# Patient Record
Sex: Male | Born: 1975 | Race: Black or African American | Hispanic: No | Smoking: Never smoker
Health system: Southern US, Community
[De-identification: ages and names within clinical notes are randomized; demographics above are authoritative.]

## PROBLEM LIST (undated history)

## (undated) DIAGNOSIS — E119 Type 2 diabetes mellitus without complications: Secondary | ICD-10-CM

## (undated) DIAGNOSIS — E7800 Pure hypercholesterolemia, unspecified: Secondary | ICD-10-CM

---

## 2005-06-07 ENCOUNTER — Encounter: Admission: RE | Admit: 2005-06-07 | Discharge: 2005-06-07 | Payer: Self-pay | Admitting: Internal Medicine

## 2007-01-21 IMAGING — RF DG UGI W/ HIGH DENSITY W/KUB
20 of 24 series · 20 of 24 positions shown · non-contrast
Comparison: None.

CLINICAL DATA: 29 year old with heartburn and reflux. 
 DOUBLE CONTRAST UPPER G.I. WITH KUB:

[Series 1: run · 1 of 1 slices shown (1 of 20)]
[im 1/1]
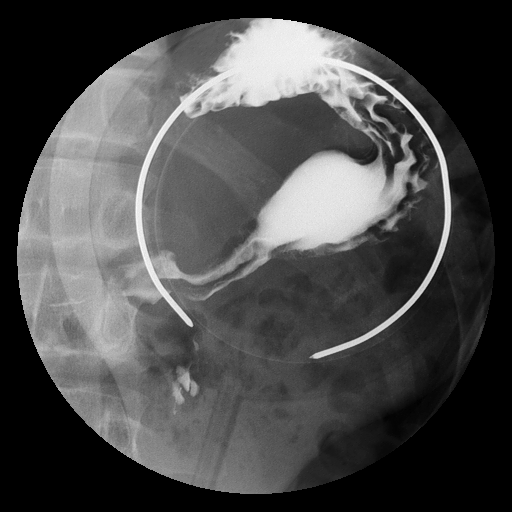

[Series 2: run · 1 of 1 slices shown (2 of 20)]
[im 1/1]
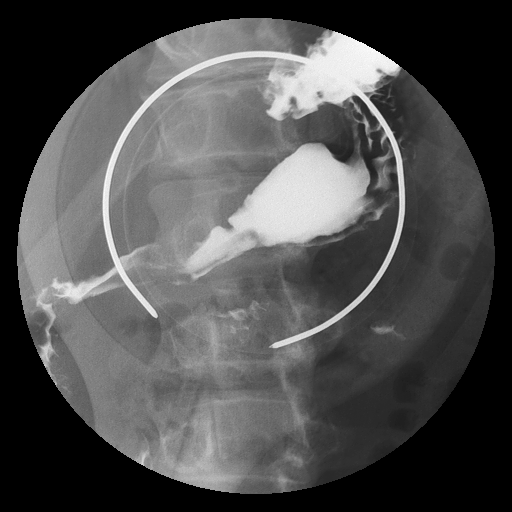

[Series 4: run · 1 of 1 slices shown (3 of 20)]
[im 1/1]
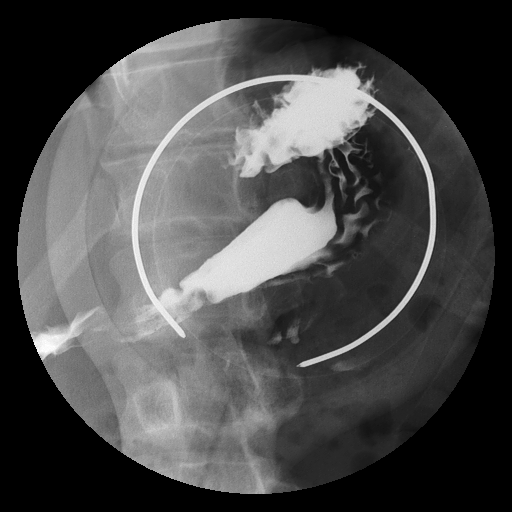

[Series 5: run · 1 of 1 slices shown (4 of 20)]
[im 1/1]
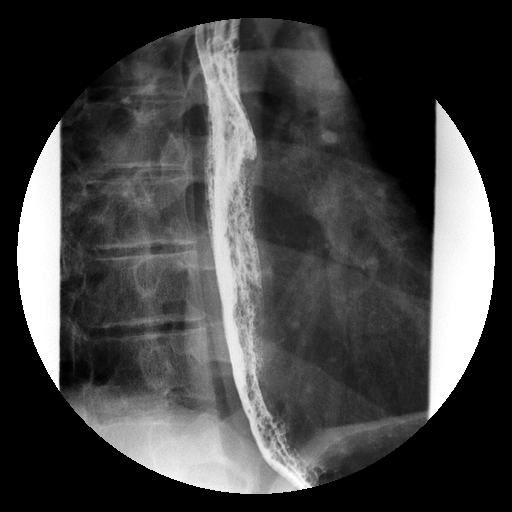

[Series 6: run · 1 of 1 slices shown (5 of 20)]
[im 1/1]
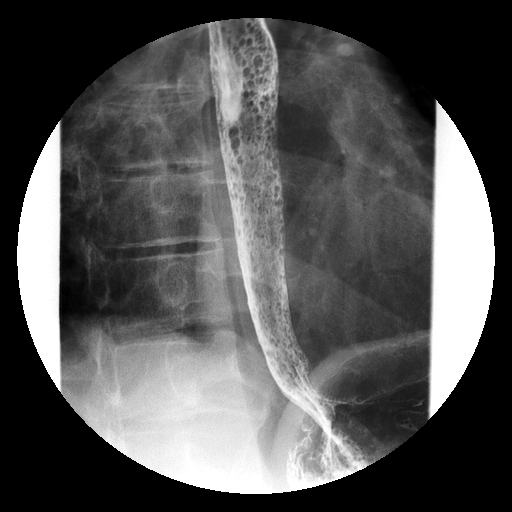

[Series 7: run · 1 of 1 slices shown (6 of 20)]
[im 1/1]
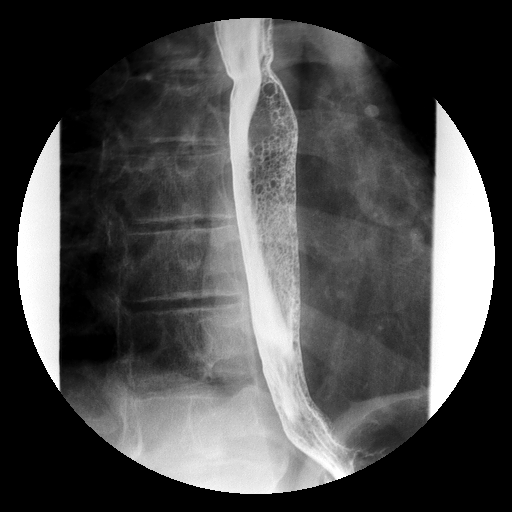

[Series 8: run · 1 of 1 slices shown (7 of 20)]
[im 1/1]
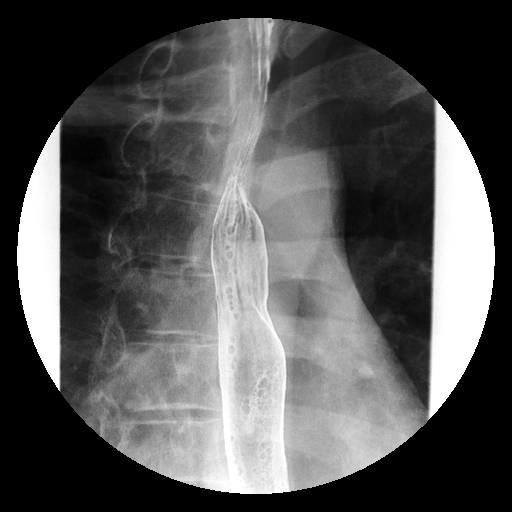

[Series 10: run · 1 of 1 slices shown (8 of 20)]
[im 1/1]
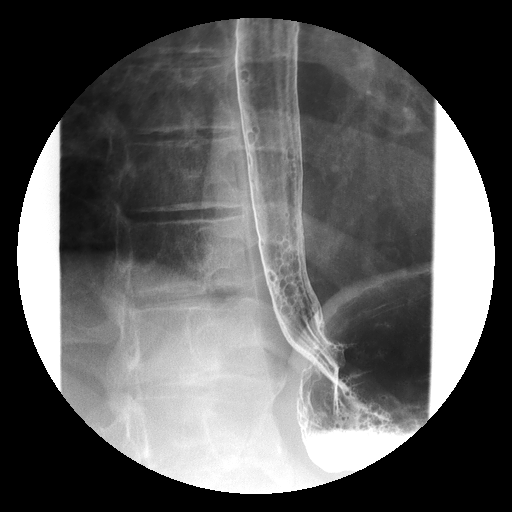

[Series 11: run · 1 of 1 slices shown (9 of 20)]
[im 1/1]
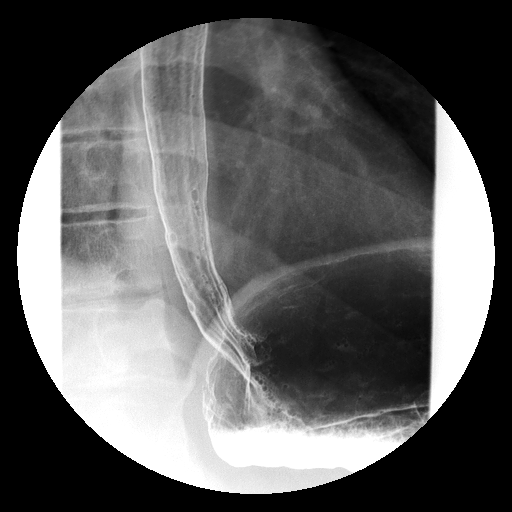

[Series 12: run · 1 of 1 slices shown (10 of 20)]
[im 1/1]
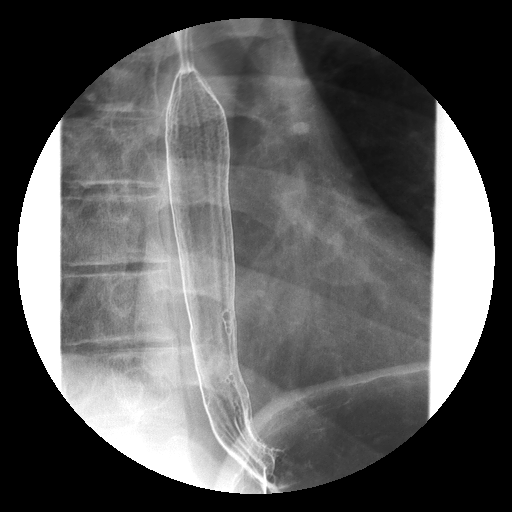

[Series 13: run · 1 of 1 slices shown (11 of 20)]
[im 1/1]
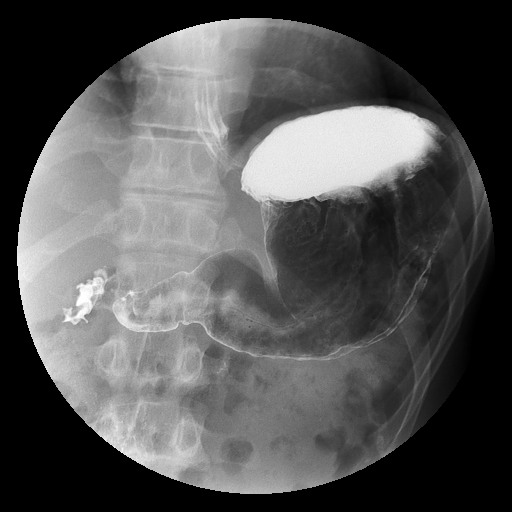

[Series 14: run · 1 of 1 slices shown (12 of 20)]
[im 1/1]
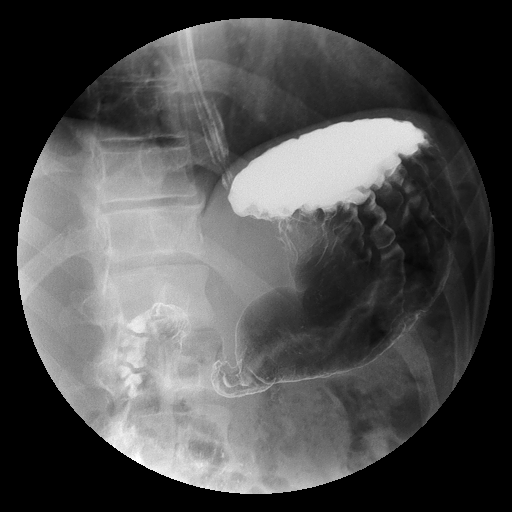

[Series 16: run · 1 of 1 slices shown (13 of 20)]
[im 1/1]
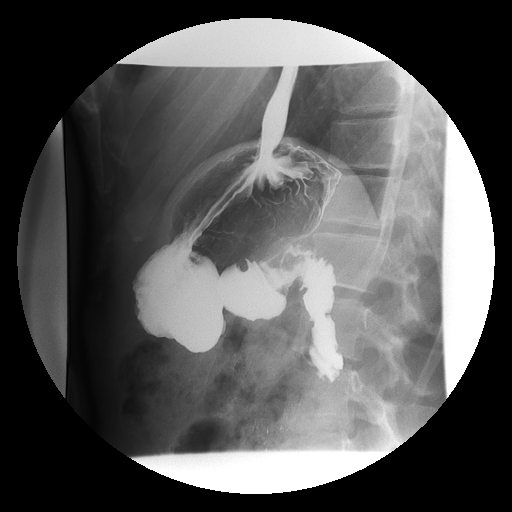

[Series 17: run · 1 of 1 slices shown (14 of 20)]
[im 1/1]
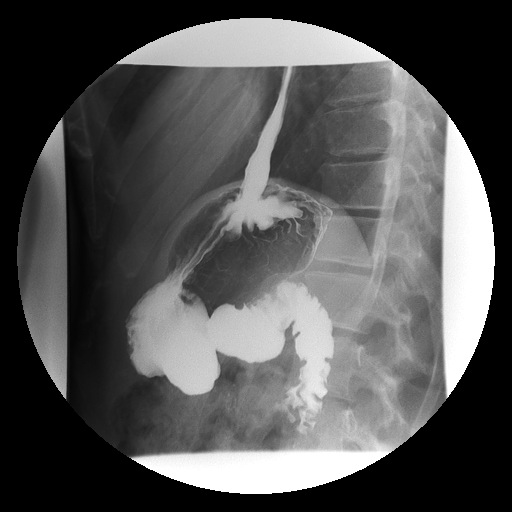

[Series 18: run · 1 of 1 slices shown (15 of 20)]
[im 1/1]
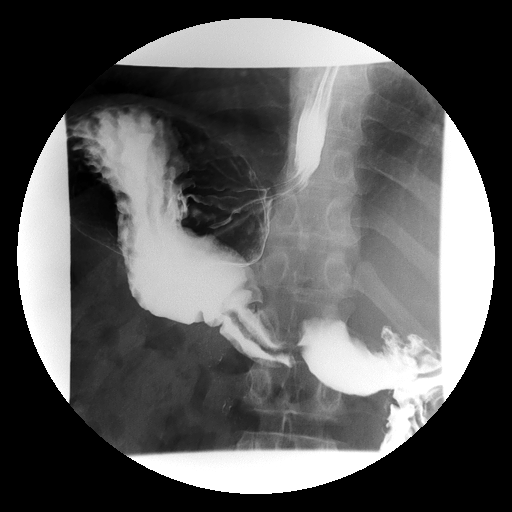

[Series 19: run · 1 of 1 slices shown (16 of 20)]
[im 1/1]
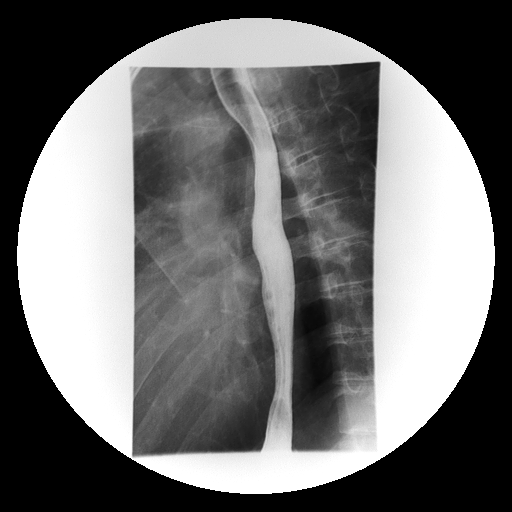

[Series 20: run · 1 of 1 slices shown (17 of 20)]
[im 1/1]
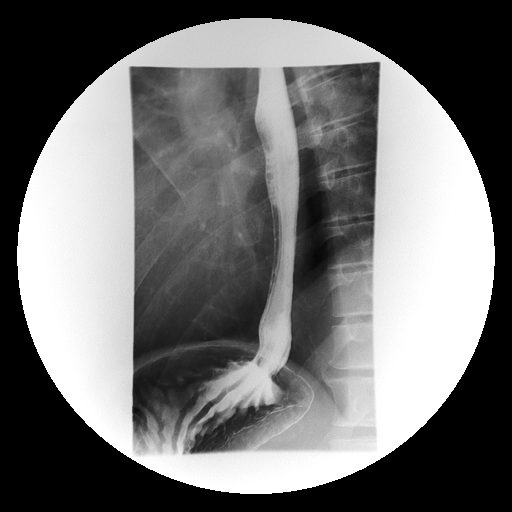

[Series 22: run · 1 of 1 slices shown (18 of 20)]
[im 1/1]
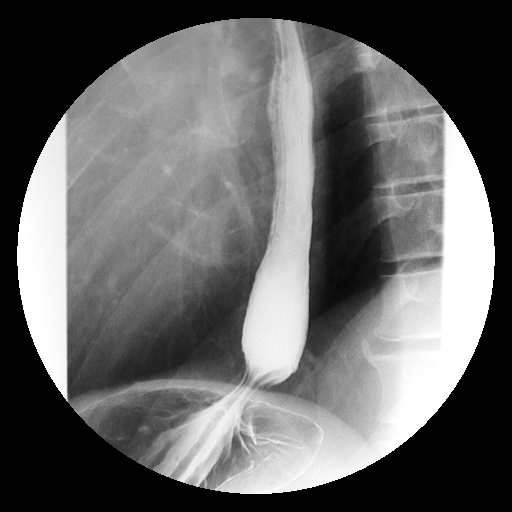

[Series 23: run · 1 of 1 slices shown (19 of 20)]
[im 1/1]
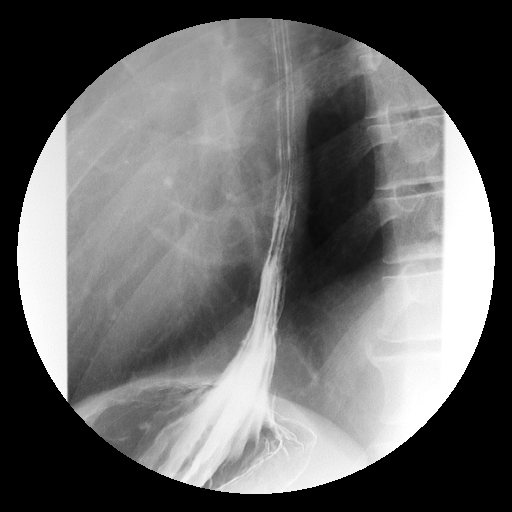

[Series 24: run · 1 of 1 slices shown (20 of 20)]
[im 1/1]
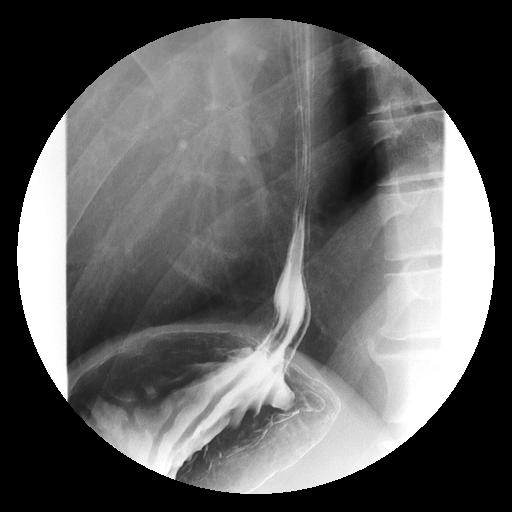

[20 of 24 positions shown; findings below may reference images not displayed]

FINDINGS: Initial barium swallows demonstrate normal esophageal motility.  No intrinsic or extrinsic lesions of the esophagus were identified and no mucosal abnormalities were seen.  The patient does have a small sliding type hiatal hernia. The patient had episodes of spontaneous and inducible GE reflux.  The stomach, duodenal bulb and C-loop are normal in appearance.
IMPRESSION: 1.  Small hiatal hernia with spontaneous and inducible GE reflux. 
 2.  Normal appearance of the stomach and duodenum.

## 2016-04-22 ENCOUNTER — Ambulatory Visit: Payer: Self-pay | Admitting: Podiatry

## 2016-05-13 ENCOUNTER — Ambulatory Visit (INDEPENDENT_AMBULATORY_CARE_PROVIDER_SITE_OTHER): Payer: BC Managed Care – PPO

## 2016-05-13 ENCOUNTER — Ambulatory Visit (INDEPENDENT_AMBULATORY_CARE_PROVIDER_SITE_OTHER): Payer: BC Managed Care – PPO | Admitting: Podiatry

## 2016-05-13 ENCOUNTER — Encounter: Payer: Self-pay | Admitting: Podiatry

## 2016-05-13 VITALS — BP 132/86 | HR 78 | Resp 18

## 2016-05-13 DIAGNOSIS — M779 Enthesopathy, unspecified: Secondary | ICD-10-CM | POA: Diagnosis not present

## 2016-05-13 DIAGNOSIS — M722 Plantar fascial fibromatosis: Secondary | ICD-10-CM | POA: Diagnosis not present

## 2016-05-13 DIAGNOSIS — M205X2 Other deformities of toe(s) (acquired), left foot: Secondary | ICD-10-CM

## 2016-05-16 NOTE — Progress Notes (Signed)
Subjective:     Patient ID: Denario Bagot, male   DOB: 03-23-1975, 41 y.o.   MRN: [id number]  HPI 41 year old male presents the office today for concerns of left foot pain and possible bunion. This been ongoing for several years. He states the areas painful pressure in shoes. He states that the area throbs and gets red at times. He said no recent treatment for. He states it is not bothersome today. 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 He denies any recent injury or trauma. No other complaints.  Review of Systems  All other systems reviewed and are negative.      Objective:   Physical Exam General: AAO x3, NAD  Dermatological: Skin is warm, dry and supple bilateral. Nails x 10 are well manicured; remaining integument appears unremarkable at this time. There are no open sores, no preulcerative lesions, no rash or signs of infection present.  Vascular: Dorsalis Pedis artery and Posterior Tibial artery pedal pulses are 2/4 bilateral with immedate capillary fill time. Pedal hair growth present. There is no pain with calf compression, swelling, warmth, erythema.   Neruologic: Grossly intact via light touch bilateral. Vibratory intact via tuning fork bilateral. Protective threshold with Semmes Wienstein monofilament intact to all pedal sites bilateral.   Musculoskeletal: There is a decreased range of motion left first MTPJ more so compared to the right. There is a dorsal medial spurring present off the first metatarsal. There is no edema, erythema, increase in warmth. There is minimal tenderness palpation to the area today. Decrease in medial arch height upon weightbearing. Occasionally he does get some symptoms of pain in the arch of the foot however he is not experiencing this today. Equinus is present. MMT 5/5.  Gait: Unassisted, Nonantalgic.      Assessment:     41 year old male left hallux limitus, plantar fasciitis/arch pain    Plan:      -Treatment options discussed including all alternatives, risks, and complications -Etiology of symptoms were discussed -X-rays were obtained and reviewed with the patient. Arthritic changes of present the first MPJ. No evidence of acute fracture. -I discussed steroid injection to the area today however as his been ongoing for 2 years I believe that a steroid injection would be temporary for him. I believe that long-term benefit more from a custom insert for modifications to help accommodate the hallux limitus. He was measured for the inserts today. We will check with insurance coverage per his request before ordering. Discussed that if we do not do custom been tried over-the-counter insert and I can try to do some modifications to that as well. Also discussion shoe modifications.   Ovid Curd, DPM

## 2016-06-04 ENCOUNTER — Other Ambulatory Visit: Payer: BC Managed Care – PPO

## 2016-06-13 ENCOUNTER — Other Ambulatory Visit: Payer: BC Managed Care – PPO

## 2016-06-20 ENCOUNTER — Other Ambulatory Visit (INDEPENDENT_AMBULATORY_CARE_PROVIDER_SITE_OTHER): Payer: BC Managed Care – PPO

## 2016-06-20 DIAGNOSIS — M205X2 Other deformities of toe(s) (acquired), left foot: Secondary | ICD-10-CM | POA: Diagnosis not present

## 2016-06-20 DIAGNOSIS — M779 Enthesopathy, unspecified: Secondary | ICD-10-CM

## 2016-06-20 DIAGNOSIS — M722 Plantar fascial fibromatosis: Secondary | ICD-10-CM

## 2019-12-03 ENCOUNTER — Ambulatory Visit (HOSPITAL_COMMUNITY)
Admission: EM | Admit: 2019-12-03 | Discharge: 2019-12-03 | Discharge status: Against Medical Advice | Payer: No Typology Code available for payment source

## 2019-12-03 ENCOUNTER — Other Ambulatory Visit: Payer: Self-pay

## 2019-12-04 ENCOUNTER — Encounter (HOSPITAL_COMMUNITY): Payer: Self-pay

## 2019-12-04 ENCOUNTER — Ambulatory Visit (HOSPITAL_COMMUNITY)
Admission: EM | Admit: 2019-12-04 | Discharge: 2019-12-04 | Disposition: A | Discharge status: Home / Self Care | Payer: No Typology Code available for payment source | Attending: Family Medicine | Admitting: Family Medicine

## 2019-12-04 VITALS — BP 140/78 | HR 85 | Temp 98.0°F | Resp 18

## 2019-12-04 DIAGNOSIS — S0990XA Unspecified injury of head, initial encounter: Secondary | ICD-10-CM

## 2019-12-04 HISTORY — DX: Type 2 diabetes mellitus without complications: E11.9

## 2019-12-04 HISTORY — DX: Pure hypercholesterolemia, unspecified: E78.00
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
## 2019-12-04 NOTE — Discharge Instructions (Signed)

## 2020-08-16 ENCOUNTER — Ambulatory Visit: Payer: Self-pay | Admitting: Podiatrist

## 2020-08-17 ENCOUNTER — Other Ambulatory Visit: Payer: Self-pay | Admitting: Podiatrist

## 2020-08-17 ENCOUNTER — Ambulatory Visit (INDEPENDENT_AMBULATORY_CARE_PROVIDER_SITE_OTHER): Payer: Self-pay

## 2020-08-17 ENCOUNTER — Other Ambulatory Visit: Payer: Self-pay

## 2020-08-17 ENCOUNTER — Encounter: Payer: Self-pay | Admitting: Podiatrist

## 2020-08-17 ENCOUNTER — Ambulatory Visit (INDEPENDENT_AMBULATORY_CARE_PROVIDER_SITE_OTHER): Payer: Self-pay | Admitting: Podiatrist

## 2020-08-17 DIAGNOSIS — M79672 Pain in left foot: Secondary | ICD-10-CM

## 2020-08-17 DIAGNOSIS — M2022 Hallux rigidus, left foot: Secondary | ICD-10-CM

## 2020-08-17 NOTE — Progress Notes (Signed)
       Chief Complaint  Patient presents with   Toe Pain    Left 1st digit lack of range of motion and painful, since 2018.     HPI: Patient is 45 y.o. male who presents today for the concerns as listed above.  He relates pain with range of motion at the great toe joint since 2018.  He denies any injury to the foot.   There are no problems to display for this patient.   Current Outpatient Medications on File Prior to Visit  Medication Sig Dispense Refill   atorvastatin (LIPITOR) 10 MG tablet Take 10 mg by mouth daily.     metFORMIN (GLUCOPHAGE) 500 MG tablet Take 500 mg by mouth 2 (two) times daily.     No current facility-administered medications on file prior to visit.    No Known Allergies  Review of Systems No fevers, chills, nausea, muscle aches, no difficulty breathing, no calf pain, no chest pain or shortness of breath.   Physical Exam  GENERAL APPEARANCE: Alert, conversant. Appropriately groomed. No acute distress.   VASCULAR: Pedal pulses palpable DP and PT bilateral.  Capillary refill time is immediate to all digits,  Proximal to distal cooling it warm to warm.  Digital perfusion adequate.   NEUROLOGIC: sensation is intact to 5.07 monofilament at 5/5 sites bilateral.  Light touch is intact bilateral, vibratory sensation intact bilateral  MUSCULOSKELETAL: acceptable muscle strength, tone and stability bilateral.  No gross boney pedal deformities noted.  No pain, crepitus or limitation noted with foot and ankle range of motion bilateral.   DERMATOLOGIC: skin is warm, supple, and dry.  No open lesions noted.  No rash, no pre ulcerative lesions. Digital nails are asymptomatic.    Xrays:  3 views left foot obtained.  Decreased joint space at the first metatarsal phalangeal joint is noted.  Joint spurring and osteophytic spurring is also present.  Short first metatarsal compared to the second and third metatarsal noted.  No acute osseous abnormalities present.    Assessment     ICD-10-CM   1. Pain in left foot  M79.672 CANCELED: DG Foot Complete Left    2. Hallux rigidus of left foot  M20.22        Plan  Discussed xray and exam findings with the patient.  Discussed the arthritis is present in the great toe joint and discussed the eitiology and pathology of this condition.  I discussed we could try a cortisone injection andhe would like to hold off today.  Recommended a stiff soled hiking shoe or a carbon fiber insert for his shoe-  we did not have the correct sizing in the carbon fiber insert and will call him when this is available.   Discussed he could ultimately require surgical intervention to correct this problem.  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  He will consider his options and let us know if he would like to follow up.

## 2020-08-17 NOTE — Patient Instructions (Signed)
For surgical considerations- follow up with Dr. Ardelle Anton or Dr. Charlsie Merles   Hallux Rigidus  Hallux rigidus is a type of joint pain or joint disease (arthritis) that affects your big toe (hallux). This condition involves the joint that connects the base of your big toe to the main part of your foot (metatarsophalangeal joint or MTP joint). This condition can cause your big toe to become stiff, painful, and difficult to move. Symptoms may get worse with movement or in cold or damp weather. Thecondition gets worse over time. What are the causes? This condition may be caused by having a foot that does not function the way that it should or that has an abnormal shape (structural deformity). These foot problems can run in families and may be passed down from parents to children (are hereditary). This condition can also be caused by: Injury. Overuse. Certain inflammatory diseases, including gout and rheumatoid arthritis. What increases the risk? You are more likely to develop this condition if you have: A foot bone (metatarsal) that is longer or higher than normal. A family history of hallux rigidus. Previously injured your big toe. Feet that do not have a curve (arch) on the inner side of the foot. This may be called flat feet or fallen arches. Ankles that turn in when you walk (pronation). Rheumatoid arthritis or gout. A job that requires you to stoop down often at work. What are the signs or symptoms? Symptoms of this condition include: Big toe pain. Stiffness and difficulty moving the big toe. Swelling of the toe and surrounding area. Bone spurs. These are bony growths that can form on the joint of the big toe. A limp. How is this diagnosed? This condition is diagnosed based on your medical history and a physical exam.You may also have X-rays. How is this treated? This condition is treated by: Wearing roomy, comfortable shoes that have a large toe box. Putting orthotic devices in your  shoes. Taking pain medicines. Having physical therapy. Icing the injured area. Alternating between putting your foot in cold water and then in warm water. If your condition is severe, treatment may include: Corticosteroid injections to relieve pain. Surgery to remove bone spurs, fuse damaged bones together, or replace the entire joint. Follow these instructions at home: Managing pain, stiffness, and swelling  Put your feet in cold water for 30 seconds, and then in warm water for 30 seconds. Alternate between the cold and warm water for 5 minutes. Do this several times a day or as told by your health care provider. If directed, put ice on the injured area. Put ice in a plastic bag. Place a towel between your skin and the bag. Leave the ice on for 20 minutes, 2-3 times a day.  General instructions Take over-the-counter and prescription medicines only as told by your health care provider. Do not wear high heels or other restrictive footwear. Wear comfortable, supportive shoes that have a large toe box. Wear shoe inserts (orthotics) as told by your health care provider, if this applies. Do foot exercises as instructed by your health care provider or a physical therapist. Keep all follow-up visits as told by your health care provider. This is important. Contact a health care provider if: You notice bone spurs or growths on or around your big toe. Your pain does not get better or it gets worse. You have pain while resting. You have pain in other parts of your body, such as your back, hip, or knee. You start to limp. Summary  Hallux rigidus is a condition that makes your big toe become stiff, painful, and difficult to move. It can be caused by injury, overuse, or inflammatory diseases. This condition may be treated with ice, medicines, physical therapy, and surgery. Do not wear high heels or other restrictive footwear. Wear comfortable, supportive shoes that have a large toe box. This  information is not intended to replace advice given to you by your health care provider. Make sure you discuss any questions you have with your healthcare provider. Document Revised: 11/07/2017 Document Reviewed: 11/10/2017 Elsevier Patient Education  2022 [employer].

## 2020-08-18 ENCOUNTER — Telehealth: Payer: Self-pay

## 2020-08-18 NOTE — Telephone Encounter (Signed)
Pt called to inform inserts were available, pt stated he will come in today to pick them up.

## 2023-03-06 ENCOUNTER — Encounter: Payer: Self-pay | Admitting: Podiatry

## 2023-03-06 ENCOUNTER — Ambulatory Visit (INDEPENDENT_AMBULATORY_CARE_PROVIDER_SITE_OTHER): Payer: 59

## 2023-03-06 ENCOUNTER — Ambulatory Visit (INDEPENDENT_AMBULATORY_CARE_PROVIDER_SITE_OTHER): Payer: 59 | Admitting: Podiatry

## 2023-03-06 DIAGNOSIS — M205X2 Other deformities of toe(s) (acquired), left foot: Secondary | ICD-10-CM | POA: Diagnosis not present

## 2023-03-06 DIAGNOSIS — M722 Plantar fascial fibromatosis: Secondary | ICD-10-CM

## 2023-03-06 MED ORDER — TRIAMCINOLONE ACETONIDE 10 MG/ML IJ SUSP: 5.0000 mg | Freq: Once | INTRAMUSCULAR | Status: DC

## 2023-03-06 MED ORDER — MELOXICAM 15 MG PO TABS: 15.0000 mg | ORAL_TABLET | Freq: Every day | ORAL | 0 refills | Status: AC

## 2023-03-06 NOTE — Progress Notes (Signed)
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 male presenting today with c/o pain in the bottom of the right heel.  States the pain is in the right plantar medial heel.  Had previously been wearing inserts.  Initially started in the mornings, now is constant with activity.  Patient states he ices occasionally.  Some aching for the past 3 months.  He also reports continued left first MPJ pain.  He saw Dr. Donzetta Kohut for this several years ago.  It is largely unchanged. Previously had been using inserts, states that this was very helpful for him.  Patient is diabetic.  Last A1c 7.0.  Patient also works as a Dentist.  Past Medical History:  Diagnosis Date   Diabetes mellitus without complication (HCC)    Hypercholesterolemia     History reviewed. No pertinent surgical history.  No Known Allergies   Physical Exam: General: The patient is alert and oriented x3 in no acute distress.  Dermatology:  No ecchymosis, erythema, or edema bilateral.  No open lesions.    Vascular: Palpable pedal pulses bilaterally. Capillary refill within normal limits.  No appreciable edema.    Neurological: Light touch sensation intact bilateral.  MMT 5/5 to lower extremity bilateral. Negative Tinel's sign with percussion of the posterior tibial nerve on the affected extremity.    Musculoskeletal Exam:  There is pain on palpation of the plantarmedial & plantarcentral aspect of right heel.  No gaps or nodules within the plantar fascia.  Positive Windlass mechanism bilateral.  Antalgic gait noted with first few steps upon standing.  No pain on palpation of  achilles tendon bilateral.  Ankle df less than 10 degrees with knee extended b/l.  Pes planus foot type bilaterally.  Decreased first MPJ range of motion noted bilaterally, left greater than right.  No pain on direct palpation of the joint.  Dorsal jamming appreciated.  Radiographic Exam: Right foot 3 views 03/05/2022 Normal osseous mineralization. Joint spaces preserved.  No fractures or osseous irregularities noted.  Pes planus foot type.  Some spur formation present to Achilles insertion.  Left foot 3 views 03/05/2022: Normal osseous mineralization. Joint spaces preserved.  No fractures or osseous irregularities noted.  Pes planus foot type.  Dorsal flag sign first metatarsal head.  Some valgus drift to the first toe.  Assessment/Plan of Care: 1. Hallux limitus of left foot   2. Plantar fasciitis of right foot     Meds ordered this encounter  Medications   triamcinolone acetonide (KENALOG) 10 MG/ML injection 5 mg   meloxicam (MOBIC) 15 MG tablet    Sig: Take 1 tablet (15 mg total) by mouth daily for 21 days.    Dispense:  21 tablet    Refill:  0   None  # Right foot plan fasciitis -Reviewed etiology of plantar fasciitis with patient.  Radiographs taken and reviewed with patient.  Discussed treatment options with patient today, including cortisone injection, NSAID course of treatment, stretching exercises, physical therapy, use of night splint, rest, icing the heel, arch supports/orthotics,  and supportive shoe gear.    With the patient's verbal consent, a corticosteroid injection was administered to the right heel, consisting of a mixture of 1% lidocaine plain, 0.5% Sensorcaine plain, and Kenalog-10 for a total of 1.5cc administered.  A Band-aid was applied.  Plantar fascia brace dispensed and applied.  # Left foot hallux limitus -Did discuss conservative versus surgical treatment for this condition.  Discussed possible joint cleaner procedure.  Conservatively, discussed use of stiff  soled shoes.  Patient states that he has tried some inserts in the past which have not been very helpful for him. -Radiographs reviewed with the patient -Course of oral meloxicam prescribed, beneficial for both the plan fasciitis and for the first MPJ pain. -Patient electing to continue with conservative therapy at least until heel pain is resolved. -I certify that this diagnosis represents a distinct and separate diagnosis that requires evaluation and treatment separate from other procedures or diagnosis   Return in 3 weeks (on 03/27/2023), or if symptoms worsen or fail to improve, for Plantar Fasciitis.   Bronwen Betters, DPM, AACFAS [facility]     [address] [place], [place] [address]                Office [phone]  Fax [phone]

## 2023-03-06 NOTE — Patient Instructions (Signed)

## 2023-07-10 ENCOUNTER — Ambulatory Visit: Admitting: Podiatry

## 2023-07-10 ENCOUNTER — Encounter: Payer: Self-pay | Admitting: Podiatry

## 2023-07-10 DIAGNOSIS — M2142 Flat foot [pes planus] (acquired), left foot: Secondary | ICD-10-CM

## 2023-07-10 DIAGNOSIS — M722 Plantar fascial fibromatosis: Secondary | ICD-10-CM

## 2023-07-10 DIAGNOSIS — M2141 Flat foot [pes planus] (acquired), right foot: Secondary | ICD-10-CM

## 2023-07-10 MED ORDER — TRIAMCINOLONE ACETONIDE 10 MG/ML IJ SUSP
10.0000 mg | Freq: Once | INTRAMUSCULAR | Status: AC
  Administered 2023-07-10: 10 mg

## 2023-07-10 MED ORDER — MELOXICAM 15 MG PO TABS: 15.0000 mg | ORAL_TABLET | Freq: Every day | ORAL | 0 refills | Status: AC

## 2023-07-10 NOTE — Patient Instructions (Addendum)

## 2023-07-10 NOTE — Progress Notes (Signed)
 Chief Complaint  Patient presents with   Plantar Fasciitis    Rm15 Plantar fasciitis flare right foot. Pain started 3 months ago with aching and soreness.    HPI: 48 y.o. male presenting today following up for right heel plantar fasciitis.  He was doing well for a while after initial injection.  This been bothering him again for couple months at this point.  Complains of sharp stabbing pain to right heel.  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  Past Medical History:  Diagnosis Date   Diabetes mellitus without complication (HCC)    Hypercholesterolemia     History reviewed. No pertinent surgical history.  No Known Allergies   Physical Exam: General: The patient is alert and oriented x3 in no acute distress.  Dermatology:  No ecchymosis, erythema, or edema bilateral.  No open lesions.    Vascular: Palpable pedal pulses bilaterally. Capillary refill within normal limits.  No appreciable edema.    Neurological: Light touch sensation intact bilateral.  MMT 5/5 to lower extremity bilateral. Negative Tinel's sign with percussion of the posterior tibial nerve on the affected extremity.    Musculoskeletal Exam:  There is pain on palpation of the plantarmedial & plantarcentral aspect of right heel.  No gaps or nodules within the plantar fascia.  Positive Windlass mechanism bilateral.  Antalgic gait noted with first few steps upon standing.  No pain on palpation of achilles tendon bilateral.  Ankle df less than 10 degrees with knee extended b/l.  Pes planus foot type bilaterally.  Decreased first MPJ range of motion noted bilaterally, left greater than right.  No pain on direct palpation of the joint.  Dorsal jamming appreciated.  Radiographic Exam: Right foot 3 views 03/05/2022 Normal osseous mineralization. Joint spaces preserved.  No fractures or osseous irregularities noted.  Pes planus foot type.  Some spur formation  present to Achilles insertion.  Left foot 3 views 03/05/2022: Normal osseous mineralization. Joint spaces preserved.  No fractures or osseous irregularities noted.  Pes planus foot type.  Dorsal flag sign first metatarsal head.  Some valgus drift to the first toe.  Assessment/Plan of Care: 1. Plantar fasciitis of right foot   2. Pes planus of both feet     Meds ordered this encounter  Medications   triamcinolone  acetonide (KENALOG ) 10 MG/ML injection 10 mg   meloxicam  (MOBIC ) 15 MG tablet    Sig: Take 1 tablet (15 mg total) by mouth daily.    Dispense:  30 tablet    Refill:  0   None  # Right foot plan fasciitis -Reviewed etiology of plantar fasciitis with patient. Discussed treatment options with patient today, including cortisone injection, NSAID course of treatment, stretching exercises, physical therapy, use of night splint, rest, icing the heel, arch supports/orthotics, and supportive shoe gear.    Previously dispensed fascial brace and he has been using night splint.  Continue with this.  Reviewed stretching regimen with patient.  Second course of oral meloxicam  15 mg a day once a day 30 tablets  With the patient's verbal consent, a corticosteroid injection was administered to the right heel, consisting of a mixture of 1% lidocaine plain, 0.5% Sensorcaine plain, and Kenalog -10 for a total of 1.5cc administered.  A Band-aid was applied.  Patient may benefit from custom orthotics if we could relief of symptoms going forward due to his pes planus and left foot hallux  limitus.  Return in about 2 weeks (around 07/24/2023) for Plantar Fasciitis.   Eve Hinders, DPM, AACFAS [facility]     [address] [place], [place] [address]                Office [phone]  Fax [phone]

## 2023-07-13 ENCOUNTER — Encounter: Payer: Self-pay | Admitting: Podiatry

## 2023-07-25 ENCOUNTER — Ambulatory Visit: Admitting: Podiatry

## 2023-07-25 ENCOUNTER — Encounter: Payer: Self-pay | Admitting: Podiatry

## 2023-07-25 DIAGNOSIS — M2142 Flat foot [pes planus] (acquired), left foot: Secondary | ICD-10-CM | POA: Diagnosis not present

## 2023-07-25 DIAGNOSIS — M2141 Flat foot [pes planus] (acquired), right foot: Secondary | ICD-10-CM

## 2023-07-25 DIAGNOSIS — M722 Plantar fascial fibromatosis: Secondary | ICD-10-CM

## 2023-07-25 NOTE — Progress Notes (Unsigned)
       Chief Complaint  Patient presents with   Follow-up    Patient states he still is having some pain but not to bad.     HPI: 48 y.o. male presenting today following up for right heel plantar fasciitis.  He reports 50% improvement or so from last visit since injection.  Reports mixed compliance with the plantar fascial brace.  He states that he has been using some over-the-counter inserts as well.  Past Medical History:  Diagnosis Date   Diabetes mellitus without complication (HCC)    Hypercholesterolemia     History reviewed. No pertinent surgical history.  No Known Allergies   Physical Exam: General: The patient is alert and oriented x3 in no acute distress.  Dermatology:  No ecchymosis, erythema, or edema bilateral.  No open lesions.    Vascular: Palpable pedal pulses bilaterally. Capillary refill within normal limits.  No appreciable edema.    Neurological: Light touch sensation intact bilateral.  MMT 5/5 to lower extremity bilateral. Negative Tinel's sign with percussion of the posterior tibial nerve on the affected extremity.    Musculoskeletal Exam:  There is decreased pain on palpation of the plantarmedial & plantarcentral aspect of right heel.  No gaps or nodules within the plantar fascia.  Positive Windlass mechanism bilateral.  Antalgic gait noted with first few steps upon standing.  No pain on palpation of achilles tendon bilateral.  Ankle df less than 10 degrees with knee extended b/l.  Pes planus foot type bilaterally.  Decreased first MPJ range of motion noted bilaterally, left greater than right.  No pain on direct palpation of the joint.  Dorsal jamming appreciated.   Assessment/Plan of Care: 1. Plantar fasciitis of right foot   2. Pes planus of both feet     No orders of the defined types were placed in this encounter.  None  # Right foot plan fasciitis -Reviewed etiology of plantar fasciitis with patient. Discussed treatment options with patient  today, including cortisone injection, NSAID course of treatment, stretching exercises, physical therapy, use of night splint, rest, icing the heel, arch supports/orthotics, and supportive shoe gear.    Previously dispensed fascial brace and he has been using night splint.  Continue with this.  Reviewed proper use and application.  Reviewed stretching regimen with patient.  Complete course of oral meloxicam  15 mg a day once a day 30 tablets  Did discuss a second corticosteroid injection today.  He states that he will be on his feet quite a bit today and over the weekend, therefore deferring this.  Did discuss importance of rest and the recovery from plantar fasciitis.  Patient may benefit from custom orthotics if we could relief of symptoms going forward due to his pes planus and left foot hallux limitus.  Will have him bring in his other orthotics next visit.  If there is not continued improvement, consider PT.  Return in about 5 weeks (around 08/29/2023) for Plantar Fasciitis.   Eve Hinders, DPM, AACFAS [facility]     [address] [place], [place] [address]                Office [phone]  Fax [phone]

## 2023-07-25 NOTE — Patient Instructions (Signed)

## 2023-08-29 ENCOUNTER — Encounter: Payer: Self-pay | Admitting: Podiatry

## 2023-08-29 ENCOUNTER — Ambulatory Visit: Admitting: Podiatry

## 2023-08-29 DIAGNOSIS — M2141 Flat foot [pes planus] (acquired), right foot: Secondary | ICD-10-CM

## 2023-08-29 DIAGNOSIS — M722 Plantar fascial fibromatosis: Secondary | ICD-10-CM | POA: Diagnosis not present

## 2023-08-29 DIAGNOSIS — M2142 Flat foot [pes planus] (acquired), left foot: Secondary | ICD-10-CM

## 2023-08-29 MED ORDER — TRIAMCINOLONE ACETONIDE 10 MG/ML IJ SUSP
10.0000 mg | Freq: Once | INTRAMUSCULAR | Status: AC
  Administered 2023-08-29: 10 mg

## 2023-08-29 NOTE — Progress Notes (Signed)
       Chief Complaint  Patient presents with   Follow-up    Patient states that everything has been going good, patient states he would like an injection     HPI: 48 y.o. male presenting today following up for right heel plantar fasciitis.  He does report continued improvement though he does still have some nagging symptoms to the right heel.  Requesting injection today.  Past Medical History:  Diagnosis Date   Diabetes mellitus without complication (HCC)    Hypercholesterolemia     History reviewed. No pertinent surgical history.  No Known Allergies   Physical Exam: General: The patient is alert and oriented x3 in no acute distress.  Dermatology:  No ecchymosis, erythema, or edema bilateral.  No open lesions.    Vascular: Palpable pedal pulses bilaterally. Capillary refill within normal limits.  No appreciable edema.    Neurological: Light touch sensation intact bilateral.  MMT 5/5 to lower extremity bilateral. Negative Tinel's sign with percussion of the posterior tibial nerve on the affected extremity.    Musculoskeletal Exam:  There is decreased pain on palpation of the plantarmedial & plantarcentral aspect of right heel.  No gaps or nodules within the plantar fascia.  Positive Windlass mechanism bilateral.  Antalgic gait noted with first few steps upon standing.  No pain on palpation of achilles tendon bilateral.  Ankle df less than 10 degrees with knee extended b/l.  Pes planus foot type bilaterally.  Decreased first MPJ range of motion noted bilaterally, left greater than right.  No pain on direct palpation of the joint.  Dorsal jamming appreciated.   Assessment/Plan of Care: 1. Plantar fasciitis of right foot   2. Pes planus of both feet     Meds ordered this encounter  Medications   triamcinolone  acetonide (KENALOG ) 10 MG/ML injection 10 mg   FOR HOME USE ONLY DME [employer]  # Right foot plan fasciitis -Reviewed etiology of plantar fasciitis with  patient. Discussed treatment options with patient today, including cortisone injection, NSAID course of treatment, stretching exercises, physical therapy, use of night splint, rest, icing the heel, arch supports/orthotics, and supportive shoe gear.    Verbal consent obtained administer corticosteroid injection to right heel plantar fascia via plantar medial approach following alcohol skin prep.  Injected one-to-one ratio of 1 mL of 0.5% Marcaine plain mixed with 2% lidocaine plain along with 1 mL of Kenalog  10.  Band-Aid applied.  Patient tolerated well.  Reviewed stretching regimen with patient.  Over-the-counter NSAIDs as needed  Prescription sent in for custom orthotics which will provide added foot support for the patient's plantar fasciitis and bilateral pes planus.  This will alter the patient's biomechanics.  Return in about 3 weeks (around 09/19/2023) for Plantar Fasciitis.   Ethan Saddler, DPM, AACFAS [facility]     [address] [place], [place] [address]                Office [phone]  Fax [phone]

## 2023-08-29 NOTE — Patient Instructions (Signed)

## 2023-08-30 ENCOUNTER — Encounter: Payer: Self-pay | Admitting: Podiatry

## 2023-09-15 ENCOUNTER — Ambulatory Visit (INDEPENDENT_AMBULATORY_CARE_PROVIDER_SITE_OTHER)

## 2023-09-15 DIAGNOSIS — M722 Plantar fascial fibromatosis: Secondary | ICD-10-CM

## 2023-09-15 DIAGNOSIS — M2141 Flat foot [pes planus] (acquired), right foot: Secondary | ICD-10-CM | POA: Diagnosis not present

## 2023-09-15 DIAGNOSIS — M2142 Flat foot [pes planus] (acquired), left foot: Secondary | ICD-10-CM | POA: Diagnosis not present

## 2023-09-15 NOTE — Progress Notes (Signed)
 Patient was present and chose power steps over custom as he still owes $1407.63 on Deductible  Patient will return for custom if pwer steps do not help  Lolita Schultze CPed, CFo, CFm

## 2023-09-26 ENCOUNTER — Ambulatory Visit: Admitting: Podiatry
